# Patient Record
Sex: Female | Born: 1960 | Race: White | Hispanic: No | Smoking: Current every day smoker
Health system: Southern US, Community
[De-identification: ages and names within clinical notes are randomized; demographics above are authoritative.]

## PROBLEM LIST (undated history)

## (undated) DIAGNOSIS — I10 Essential (primary) hypertension: Secondary | ICD-10-CM

## (undated) DIAGNOSIS — E119 Type 2 diabetes mellitus without complications: Secondary | ICD-10-CM

## (undated) HISTORY — PX: ABDOMINAL HYSTERECTOMY: SHX81

## (undated) HISTORY — PX: FOOT SURGERY: SHX648

## (undated) HISTORY — PX: APPENDECTOMY: SHX54

---

## 2005-08-12 ENCOUNTER — Emergency Department (HOSPITAL_COMMUNITY): Admission: EM | Admit: 2005-08-12 | Discharge: 2005-08-12 | Payer: Self-pay | Admitting: Emergency Medicine

## 2005-10-24 ENCOUNTER — Emergency Department (HOSPITAL_COMMUNITY): Admission: EM | Admit: 2005-10-24 | Discharge: 2005-10-25 | Payer: Self-pay | Admitting: Emergency Medicine

## 2005-11-06 ENCOUNTER — Encounter: Admission: RE | Admit: 2005-11-06 | Discharge: 2005-11-06 | Payer: Self-pay | Admitting: *Deleted

## 2014-01-29 ENCOUNTER — Emergency Department (HOSPITAL_BASED_OUTPATIENT_CLINIC_OR_DEPARTMENT_OTHER)
Admission: EM | Admit: 2014-01-29 | Discharge: 2014-01-29 | Disposition: A | Discharge status: Home / Self Care | Payer: Managed Care, Other (non HMO) | Attending: Emergency Medicine | Admitting: Emergency Medicine

## 2014-01-29 ENCOUNTER — Encounter (HOSPITAL_BASED_OUTPATIENT_CLINIC_OR_DEPARTMENT_OTHER): Payer: Self-pay | Admitting: Emergency Medicine

## 2014-01-29 ENCOUNTER — Emergency Department (HOSPITAL_BASED_OUTPATIENT_CLINIC_OR_DEPARTMENT_OTHER): Payer: Managed Care, Other (non HMO)

## 2014-01-29 DIAGNOSIS — IMO0002 Reserved for concepts with insufficient information to code with codable children: Secondary | ICD-10-CM | POA: Insufficient documentation

## 2014-01-29 DIAGNOSIS — S93409A Sprain of unspecified ligament of unspecified ankle, initial encounter: Secondary | ICD-10-CM

## 2014-01-29 DIAGNOSIS — W19XXXA Unspecified fall, initial encounter: Secondary | ICD-10-CM

## 2014-01-29 DIAGNOSIS — Y939 Activity, unspecified: Secondary | ICD-10-CM | POA: Insufficient documentation

## 2014-01-29 DIAGNOSIS — Y929 Unspecified place or not applicable: Secondary | ICD-10-CM | POA: Insufficient documentation

## 2014-01-29 DIAGNOSIS — X500XXA Overexertion from strenuous movement or load, initial encounter: Secondary | ICD-10-CM | POA: Insufficient documentation

## 2014-01-29 DIAGNOSIS — W010XXA Fall on same level from slipping, tripping and stumbling without subsequent striking against object, initial encounter: Secondary | ICD-10-CM | POA: Insufficient documentation

## 2014-01-29 DIAGNOSIS — Z79899 Other long term (current) drug therapy: Secondary | ICD-10-CM | POA: Insufficient documentation

## 2014-01-29 DIAGNOSIS — S80219A Abrasion, unspecified knee, initial encounter: Secondary | ICD-10-CM

## 2014-01-29 DIAGNOSIS — F17200 Nicotine dependence, unspecified, uncomplicated: Secondary | ICD-10-CM | POA: Insufficient documentation

## 2014-01-29 MED ORDER — IBUPROFEN 600 MG PO TABS: 600.0000 mg | ORAL_TABLET | Freq: Three times a day (TID) | ORAL | Status: AC | PRN

## 2014-01-29 NOTE — ED Provider Notes (Signed)
CSN: [id number]     Arrival date & time 01/29/14  1105 History   First MD Initiated Contact with Patient 01/29/14 1112     Chief Complaint  Patient presents with  . Ankle Pain     (Consider location/radiation/quality/duration/timing/severity/associated sxs/prior Treatment) Patient is a 53 y.o. female presenting with ankle pain. The history is provided by the patient.  Ankle Pain Location:  Ankle and knee Time since incident:  1 day Injury: yes   Mechanism of injury: fall   Fall:    Fall occurred: Off a curb.   Height of fall:  <1 foot   Impact surface:  Concrete   Point of impact: L knee.   Entrapped after fall: no   Knee location:  L knee Ankle location:  R ankle Pain details:    Quality:  Aching   Radiates to:  Does not radiate   Severity:  Mild   Onset quality:  Sudden   Duration:  1 day   Timing:  Constant   Progression:  Improving Chronicity:  New Dislocation: no   Foreign body present:  No foreign bodies Tetanus status:  Up to date Relieved by:  Compression Associated symptoms: no fever     History reviewed. No pertinent past medical history. Past Surgical History  Procedure Laterality Date  . Abdominal hysterectomy    . Appendectomy    . Foot surgery     No family history on file. History  Substance Use Topics  . Smoking status: Current Every Day Smoker  . Smokeless tobacco: Not on file  . Alcohol Use: No   OB History   Grav Para Term Preterm Abortions TAB SAB Ect Mult Living                 Review of Systems  Constitutional: Negative for fever.  Respiratory: Negative for cough and shortness of breath.   Gastrointestinal: Negative for vomiting and abdominal pain.  All other systems reviewed and are negative.     Allergies  Codeine and Erythromycin  Home Medications   Prior to Admission medications   Medication Sig Start Date End Date Taking? Authorizing Mayda Shippee  pravastatin (PRAVACHOL) 40 MG tablet Take 40 mg by mouth daily.   Yes  Historical Billee Balcerzak, MD   BP 170/87  Pulse 86  Temp(Src) 98.1 F (36.7 C) (Oral)  SpO2 96% Physical Exam  Nursing note and vitals reviewed. Constitutional: She is oriented to person, place, and time. She appears well-developed and well-nourished. No distress.  HENT:  Head: Normocephalic and atraumatic.  Eyes: EOM are normal. Pupils are equal, round, and reactive to light.  Neck: Normal range of motion. Neck supple.  Cardiovascular: Normal rate and regular rhythm.  Exam reveals no friction rub.   No murmur heard. Pulmonary/Chest: Effort normal and breath sounds normal. No respiratory distress. She has no wheezes. She has no rales.  Abdominal: Soft. She exhibits no distension. There is no tenderness. There is no rebound.  Musculoskeletal: Normal range of motion. She exhibits no edema.       Left knee: She exhibits normal range of motion. Tenderness (patella) found.       Right ankle: She exhibits normal range of motion. Tenderness. Lateral malleolus and AITFL tenderness found. No medial malleolus, no CF ligament, no posterior TFL, no head of 5th metatarsal and no proximal fibula tenderness found.       Legs: Neurological: She is alert and oriented to person, place, and time.  Skin: She is not diaphoretic.  ED Course  Procedures (including critical care time) Labs Review Labs Reviewed - No data to display  Imaging Review Dg Ankle Complete Right  01/29/2014   CLINICAL DATA:  Pain post trauma  EXAM: RIGHT ANKLE - COMPLETE 3+ VIEW  COMPARISON:  None.  FINDINGS: Frontal, oblique, and lateral views were obtained. There is no fracture or effusion. Ankle mortise appears intact.  IMPRESSION: No abnormality noted.   Electronically Signed   By: Bretta BangWilliam  Woodruff M.D.   On: 01/29/2014 12:25   Dg Knee Complete 4 Views Left  01/29/2014   CLINICAL DATA:  Anterior knee pain following abrasions yesterday.  EXAM: LEFT KNEE - COMPLETE 4+ VIEW  COMPARISON:  None.  FINDINGS: The mineralization and  alignment are normal. There is no evidence of acute fracture or dislocation. The joint spaces are maintained. There is possible mild soft tissue irregularity anterior to the patella, best seen on the oblique views. No foreign body or significant joint effusion is seen.  IMPRESSION: No acute osseous findings or evidence of foreign body. Possible anterior soft tissue injury.   Electronically Signed   By: Roxy HorsemanBill  Veazey M.D.   On: 01/29/2014 12:09     EKG Interpretation None      MDM   Final diagnoses:  Fall  Knee abrasion  Ankle sprain    36F s/p fall yesterday. Tripped off a curb, hyperextended R ankle in planter flexion, landed on L knee. Pain improving, still having mild pain.  R ankle - mild lateral malleolar pain. Pain with inversion/eversion. Tenderness over ATFL. Normal ROM. Normal pulses. Mild lateral malleolus pain, no medial malleolus pain. Will xray. L knee - abrasion over knee, mild patellar pain. Normal ROM, NVI distally. Will xray. Xrays ok. Given motrin, instructed to continue wrapping it. I have reviewed all labs and imaging and considered them in my medical decision making.   Dagmar HaitWilliam Blair Walden, MD 01/29/14 [id number]

## 2014-01-29 NOTE — Discharge Instructions (Signed)
Abrasion °An abrasion is a cut or scrape of the skin. Abrasions do not extend through all layers of the skin and most heal within 10 days. It is important to care for your abrasion properly to prevent infection. °CAUSES  °Most abrasions are caused by falling on, or gliding across, the ground or other surface. When your skin rubs on something, the outer and inner layer of skin rubs off, causing an abrasion. °DIAGNOSIS  °Your caregiver will be able to diagnose an abrasion during a physical exam.  °TREATMENT  °Your treatment depends on how large and deep the abrasion is. Generally, your abrasion will be cleaned with water and a mild soap to remove any dirt or debris. An antibiotic ointment may be put over the abrasion to prevent an infection. A bandage (dressing) may be wrapped around the abrasion to keep it from getting dirty.  °You may need a tetanus shot if: °· You cannot remember when you had your last tetanus shot. °· You have never had a tetanus shot. °· The injury broke your skin. °If you get a tetanus shot, your arm may swell, get red, and feel warm to the touch. This is common and not a problem. If you need a tetanus shot and you choose not to have one, there is a rare chance of getting tetanus. Sickness from tetanus can be serious.  °HOME CARE INSTRUCTIONS  °· If a dressing was applied, change it at least once a day or as directed by your caregiver. If the bandage sticks, soak it off with warm water.   °· Wash the area with water and a mild soap to remove all the ointment 2 times a day. Rinse off the soap and pat the area dry with a clean towel.   °· Reapply any ointment as directed by your caregiver. This will help prevent infection and keep the bandage from sticking. Use gauze over the wound and under the dressing to help keep the bandage from sticking.   °· Change your dressing right away if it becomes wet or dirty.   °· Only take over-the-counter or prescription medicines for pain, discomfort, or fever as  directed by your caregiver.   °· Follow up with your caregiver within 24 48 hours for a wound check, or as directed. If you were not given a wound-check appointment, look closely at your abrasion for redness, swelling, or pus. These are signs of infection. °SEEK IMMEDIATE MEDICAL CARE IF:  °· You have increasing pain in the wound.   °· You have redness, swelling, or tenderness around the wound.   °· You have pus coming from the wound.   °· You have a fever or persistent symptoms for more than 2 3 days. °· You have a fever and your symptoms suddenly get worse. °· You have a bad smell coming from the wound or dressing.   °MAKE SURE YOU:  °· Understand these instructions. °· Will watch your condition. °· Will get help right away if you are not doing well or get worse. °Document Released: 05/12/2005 Document Revised: 07/19/2012 Document Reviewed: 07/06/2011 °[employer]® Patient Information ©2014 [employer]. ° °Ankle Sprain °An ankle sprain is an injury to the strong, fibrous tissues (ligaments) that hold the bones of your ankle joint together.  °CAUSES °An ankle sprain is usually caused by a fall or by twisting your ankle. Ankle sprains most commonly occur when you step on the outer edge of your foot, and your ankle turns inward. People who participate in sports are more prone to these types of injuries.  °  SYMPTOMS  °· Pain in your ankle. The pain may be present at rest or only when you are trying to stand or walk. °· Swelling. °· Bruising. Bruising may develop immediately or within 1 to 2 days after your injury. °· Difficulty standing or walking, particularly when turning corners or changing directions. °DIAGNOSIS  °Your caregiver will ask you details about your injury and perform a physical exam of your ankle to determine if you have an ankle sprain. During the physical exam, your caregiver will press on and apply pressure to specific areas of your foot and ankle. Your caregiver will try to move your ankle in certain  ways. An X-ray exam may be done to be sure a bone was not broken or a ligament did not separate from one of the bones in your ankle (avulsion fracture).  °TREATMENT  °Certain types of braces can help stabilize your ankle. Your caregiver can make a recommendation for this. Your caregiver may recommend the use of medicine for pain. If your sprain is severe, your caregiver may refer you to a surgeon who helps to restore function to parts of your skeletal system (orthopedist) or a physical therapist. °HOME CARE INSTRUCTIONS  °· Apply ice to your injury for 1 2 days or as directed by your caregiver. Applying ice helps to reduce inflammation and pain. °· Put ice in a plastic bag. °· Place a towel between your skin and the bag. °· Leave the ice on for 15-20 minutes at a time, every 2 hours while you are awake. °· Only take over-the-counter or prescription medicines for pain, discomfort, or fever as directed by your caregiver. °· Elevate your injured ankle above the level of your heart as much as possible for 2 3 days. °· If your caregiver recommends crutches, use them as instructed. Gradually put weight on the affected ankle. Continue to use crutches or a cane until you can walk without feeling pain in your ankle. °· If you have a plaster splint, wear the splint as directed by your caregiver. Do not rest it on anything harder than a pillow for the first 24 hours. Do not put weight on it. Do not get it wet. You may take it off to take a shower or bath. °· You may have been given an elastic bandage to wear around your ankle to provide support. If the elastic bandage is too tight (you have numbness or tingling in your foot or your foot becomes cold and blue), adjust the bandage to make it comfortable. °· If you have an air splint, you may blow more air into it or let air out to make it more comfortable. You may take your splint off at night and before taking a shower or bath. Wiggle your toes in the splint several times per  day to decrease swelling. °SEEK MEDICAL CARE IF:  °· You have rapidly increasing bruising or swelling. °· Your toes feel extremely cold or you lose feeling in your foot. °· Your pain is not relieved with medicine. °SEEK IMMEDIATE MEDICAL CARE IF: °· Your toes are numb or blue. °· You have severe pain that is increasing. °MAKE SURE YOU:  °· Understand these instructions. °· Will watch your condition. °· Will get help right away if you are not doing well or get worse. °Document Released: 08/02/2005 Document Revised: 04/26/2012 Document Reviewed: 08/14/2011 °ExitCare® Patient Information ©2014 [employer]. ° °

## 2014-01-29 NOTE — ED Notes (Signed)
Pt tripped and fell yesterday afternoon twisting and hyperextending her right ankle and skinned her left knee.

## 2021-01-18 ENCOUNTER — Other Ambulatory Visit: Payer: Self-pay

## 2021-01-18 ENCOUNTER — Encounter (HOSPITAL_BASED_OUTPATIENT_CLINIC_OR_DEPARTMENT_OTHER): Payer: Self-pay | Admitting: Emergency Medicine

## 2021-01-18 ENCOUNTER — Emergency Department (HOSPITAL_BASED_OUTPATIENT_CLINIC_OR_DEPARTMENT_OTHER)
Admission: EM | Admit: 2021-01-18 | Discharge: 2021-01-18 | Disposition: A | Discharge status: Home / Self Care | Payer: Managed Care, Other (non HMO) | Attending: Emergency Medicine | Admitting: Emergency Medicine

## 2021-01-18 DIAGNOSIS — H1131 Conjunctival hemorrhage, right eye: Secondary | ICD-10-CM | POA: Insufficient documentation

## 2021-01-18 DIAGNOSIS — Z23 Encounter for immunization: Secondary | ICD-10-CM | POA: Diagnosis not present

## 2021-01-18 DIAGNOSIS — W228XXA Striking against or struck by other objects, initial encounter: Secondary | ICD-10-CM | POA: Insufficient documentation

## 2021-01-18 DIAGNOSIS — H5711 Ocular pain, right eye: Secondary | ICD-10-CM | POA: Diagnosis present

## 2021-01-18 DIAGNOSIS — F17200 Nicotine dependence, unspecified, uncomplicated: Secondary | ICD-10-CM | POA: Insufficient documentation

## 2021-01-18 MED ORDER — TETRACAINE HCL 0.5 % OP SOLN
1.0000 [drp] | Freq: Once | OPHTHALMIC | Status: AC
  Administered 2021-01-18: 1 [drp] via OPHTHALMIC
  Filled 2021-01-18: qty 4

## 2021-01-18 MED ORDER — FLUORESCEIN SODIUM 1 MG OP STRP
ORAL_STRIP | OPHTHALMIC | Status: AC
  Administered 2021-01-18: 1 via OPHTHALMIC
  Filled 2021-01-18: qty 1

## 2021-01-18 MED ORDER — TETANUS-DIPHTH-ACELL PERTUSSIS 5-2.5-18.5 LF-MCG/0.5 IM SUSY
0.5000 mL | PREFILLED_SYRINGE | Freq: Once | INTRAMUSCULAR | Status: AC
  Administered 2021-01-18: 0.5 mL via INTRAMUSCULAR
  Filled 2021-01-18: qty 0.5

## 2021-01-18 MED ORDER — FLUORESCEIN SODIUM 1 MG OP STRP: 1.0000 | ORAL_STRIP | Freq: Once | OPHTHALMIC | Status: AC

## 2021-01-18 NOTE — ED Provider Notes (Signed)
I personally evaluated the patient during the encounter and completed a history, physical, procedures, medical decision making to contribute to the overall care of the patient and decision making for the patient briefly, the patient is a 60 y.o. female here after hitting her right eye with the back end of the shovel.  Has fairly extensive subconjunctival hemorrhage.  Does not have any obvious lacerations to the cornea or conjunctiva.  No facial lacerations.  No signs of entrapment and patient has normal extraocular motion.  She has equal and round pupils bilaterally to light.  She has 20/20 vision bilaterally.  There does not appear to be any evidence of open globe.  Overall touch base with ophthalmology to arrange for close follow-up tomorrow.  We will start artificial tears and recommend ice.  Discharged in ED in good condition.  This chart was dictated using voice recognition software.  Despite best efforts to proofread,  errors can occur which can change the documentation meaning.     EKG Interpretation None           Virgina Norfolk, DO 01/18/21 1639

## 2021-01-18 NOTE — ED Triage Notes (Signed)
Pt was killing a snake with a shovel when she hit her right eye with the handle of the shovel, occurred about half an hour PTA. Pt presents with blood noted to eye area.

## 2021-01-18 NOTE — ED Notes (Signed)
Pt states she now can't see out of her right eye. Dr. Lockie Mola made aware

## 2021-01-18 NOTE — ED Provider Notes (Signed)
[facility] EMERGENCY DEPARTMENT Provider Note   CSN: [id number] Arrival date & time: 01/18/21  1401     History Chief Complaint  Patient presents with  . Eye Injury    Becky Simpson is a 60 y.o. female.  HPI 60 year old female with no sniffing medical history presents to the ER with complaints of right eye pain after hitting her self in the eye with a shovel while try to kill a snake.  Presents with redness to the eye and some eye pain.  She states occasionally she will have some blurry vision but this will improve.  Denies any pain with eye movement.  Unsure when her last tetanus shot was    History reviewed. No pertinent past medical history.  There are no problems to display for this patient.   Past Surgical History:  Procedure Laterality Date  . ABDOMINAL HYSTERECTOMY    . APPENDECTOMY    . FOOT SURGERY       OB History   No obstetric history on file.     No family history on file.  Social History   Tobacco Use  . Smoking status: Current Every Day Smoker  . Smokeless tobacco: Never Used  Vaping Use  . Vaping Use: Never used  Substance Use Topics  . Alcohol use: No  . Drug use: No    Home Medications Prior to Admission medications   Medication Sig Start Date End Date Taking? Authorizing Provider  ibuprofen (ADVIL,MOTRIN) 600 MG tablet Take 1 tablet (600 mg total) by mouth every 8 (eight) hours as needed for moderate pain. 01/29/14   Elwin Mocha, MD  pravastatin (PRAVACHOL) 40 MG tablet Take 40 mg by mouth daily.    [provider]    Allergies    Codeine and Erythromycin  Review of Systems   Review of Systems  HENT: Negative for facial swelling.   Eyes: Positive for pain and redness. Negative for photophobia, discharge, itching and visual disturbance.    Physical Exam Updated Vital Signs BP (!) 172/96 (BP Location: Right Arm)   Pulse 88   Temp 98.4 F (36.9 C) (Oral)   Resp 18   Ht 5' 7.5" (1.715 m)   Wt 83.9 kg    SpO2 93%   BMI 28.55 kg/m   Physical Exam Vitals and nursing note reviewed.  Constitutional:      General: She is not in acute distress.    Appearance: She is well-developed. She is not diaphoretic.  HENT:     Head: Normocephalic and atraumatic.     Comments: No of hemotympanum, raccoon eyes, battle sign.  No mastoid tenderness.  No malocclusion.  No evidence of lacerations, cranial deformities. Full range of motion of head and neck.  Eyes:     Extraocular Movements: Extraocular movements intact.     Pupils: Pupils are equal, round, and reactive to light.     Comments: Right eye with significant subconjunctival hemorrhage.  Pupils equal and reactive, vision 20/20 out of both eyes.  No visible signs of entrapment, EOMs intact.  Fluorescein stain with Woods lamp performed at bedside, with no visible uptake or seidel sign. Left eye without erythema, PERRLA. EOMS intact bilaterally   Cardiovascular:     Rate and Rhythm: Normal rate and regular rhythm.     Heart sounds: No murmur heard.   Pulmonary:     Effort: Pulmonary effort is normal. No respiratory distress.     Breath sounds: Normal breath sounds.  Abdominal:  Palpations: Abdomen is soft.     Tenderness: There is no abdominal tenderness.  Musculoskeletal:        General: Normal range of motion.     Cervical back: Neck supple.  Skin:    General: Skin is warm and dry.  Neurological:     General: No focal deficit present.     Mental Status: She is alert and oriented to person, place, and time.  Psychiatric:        Mood and Affect: Mood normal.        Behavior: Behavior normal.     ED Results / Procedures / Treatments   Labs (all labs ordered are listed, but only abnormal results are displayed) Labs Reviewed - No data to display  EKG None  Radiology No results found.  Procedures Procedures   Medications Ordered in ED Medications  tetracaine (PONTOCAINE) 0.5 % ophthalmic solution 1 drop (1 drop Right Eye Given  by Other 01/18/21 1618)  fluorescein ophthalmic strip 1 strip (1 strip Right Eye Given 01/18/21 1646)  Tdap (BOOSTRIX) injection 0.5 mL (0.5 mLs Intramuscular Given 01/18/21 1645)    ED Course  I have reviewed the triage vital signs and the nursing notes.  Pertinent labs & imaging results that were available during my care of the patient were reviewed by me and considered in my medical decision making (see chart for details).    MDM Rules/Calculators/A&P                         60 year old female presents to the ER with right eye pain and redness after hitting herself in the eye with a shovel.  On arrival, she does appear to have a significant subconjunctival hemorrhage.  No visible lacerations on the cornea or the conjunctiva with fluorescein stain.  No visible facial lacerations.  No other signs of entrapment, EOMs intact.  Pupils equal and reactive, with vision 20/20 bilaterally.  No evidence of open globe injury.  No visible raccoon eyes.  Spoke with Dr. Jenene Slicker with ophthalmology, she recommends artificial tears, Tylenol, cold compress.  We will hold antibiotic cream given no visible lacerations.  Tetanus shot updated will provide close follow-up with ophthalmology tomorrow.  We discussed return precautions.  Patient voiced understanding is agreeable.  This was a shared visit with my supervising physician Dr. Lockie Mola who independently saw and evaluated the patient & provided guidance in evaluation/management/disposition ,in agreement with care   Final Clinical Impression(s) / ED Diagnoses Final diagnoses:  Subconjunctival bleed, right    Rx / DC Orders ED Discharge Orders    None       Mare Ferrari, PA-C 01/18/21 1649    Virgina Norfolk, DO 01/18/21 2325

## 2021-01-18 NOTE — Discharge Instructions (Signed)
Please use artificial tears, take Tylenol for pain, apply a ice pack. Follow up with Dr. Jenene Slicker with ophthalmology tomorrow if your symptoms persist

## 2021-11-28 ENCOUNTER — Encounter (HOSPITAL_BASED_OUTPATIENT_CLINIC_OR_DEPARTMENT_OTHER): Payer: Self-pay

## 2021-11-28 ENCOUNTER — Emergency Department (HOSPITAL_BASED_OUTPATIENT_CLINIC_OR_DEPARTMENT_OTHER): Payer: Managed Care, Other (non HMO)

## 2021-11-28 ENCOUNTER — Emergency Department (HOSPITAL_BASED_OUTPATIENT_CLINIC_OR_DEPARTMENT_OTHER)
Admission: EM | Admit: 2021-11-28 | Discharge: 2021-11-28 | Disposition: A | Discharge status: Home / Self Care | Payer: Managed Care, Other (non HMO) | Attending: Emergency Medicine | Admitting: Emergency Medicine

## 2021-11-28 ENCOUNTER — Other Ambulatory Visit: Payer: Self-pay

## 2021-11-28 DIAGNOSIS — S51811A Laceration without foreign body of right forearm, initial encounter: Secondary | ICD-10-CM | POA: Diagnosis not present

## 2021-11-28 DIAGNOSIS — W540XXA Bitten by dog, initial encounter: Secondary | ICD-10-CM | POA: Diagnosis not present

## 2021-11-28 DIAGNOSIS — Y92488 Other paved roadways as the place of occurrence of the external cause: Secondary | ICD-10-CM | POA: Insufficient documentation

## 2021-11-28 DIAGNOSIS — S80211A Abrasion, right knee, initial encounter: Secondary | ICD-10-CM | POA: Insufficient documentation

## 2021-11-28 DIAGNOSIS — S50311A Abrasion of right elbow, initial encounter: Secondary | ICD-10-CM | POA: Insufficient documentation

## 2021-11-28 DIAGNOSIS — S60511A Abrasion of right hand, initial encounter: Secondary | ICD-10-CM | POA: Insufficient documentation

## 2021-11-28 DIAGNOSIS — T148XXA Other injury of unspecified body region, initial encounter: Secondary | ICD-10-CM

## 2021-11-28 DIAGNOSIS — S59911A Unspecified injury of right forearm, initial encounter: Secondary | ICD-10-CM | POA: Diagnosis present

## 2021-11-28 HISTORY — DX: Type 2 diabetes mellitus without complications: E11.9

## 2021-11-28 HISTORY — DX: Essential (primary) hypertension: I10

## 2021-11-28 MED ORDER — LIDOCAINE-EPINEPHRINE 2 %-1:200000 IJ SOLN
10.0000 mL | Freq: Once | INTRAMUSCULAR | Status: AC
Start: 2021-11-28 — End: 2021-11-28
  Administered 2021-11-28: 10 mL
  Filled 2021-11-28: qty 20

## 2021-11-28 MED ORDER — AMOXICILLIN-POT CLAVULANATE 875-125 MG PO TABS: 1.0000 | ORAL_TABLET | Freq: Two times a day (BID) | ORAL | 0 refills | Status: AC

## 2021-11-28 NOTE — ED Triage Notes (Addendum)
Pt arrives ambulatory to ED with reports of being bitten by neighbors dog today. Neighbors report that dog was up to date on shots but could not prove it to patient she states that she will call them again to see if they can find documentation. Pt has abrasions to right knuckles and bite to left forearm. Also has some swelling and abrasions to right knee and right elbow.  ?

## 2021-11-28 NOTE — Discharge Instructions (Signed)
I have sent you in a prescription for an antibiotic that you will take twice daily for 5 days.   ? ?1. Medications: Tylenol or ibuprofen for pain, usual home medications ?2. Treatment: ice for swelling, keep wound clean with warm soap and water and keep bandage dry, do not submerge in water for 24 hours ?3. Follow Up: Please return or follow up with primary care provider in 8 days to have your stitches/staples removed or sooner if you have concerns. Return to the emergency department for increased redness, drainage of pus from the wound, or fevers/chills. ? ? ?WOUND CARE ? Keep area clean and dry for 24 hours. Do not remove bandage, if applied. ? After 24 hours, remove bandage and wash wound gently with mild soap and warm water. Reapply a new bandage after cleaning wound, if directed.  ? Continue daily cleansing with soap and water until stitches/staples are removed. ? Do not apply any ointments or creams to the wound while stitches/staples are in place, as this may cause delayed healing. ?Return if you experience any of the following signs of infection: Swelling, redness, pus drainage, streaking, fever >101.0 F ? Return if you experience excessive bleeding that does not stop after 15-20 minutes of constant, firm pressure. ? ?

## 2021-11-28 NOTE — ED Provider Notes (Signed)
?[facility] EMERGENCY DEPARTMENT ?Provider Note ? ? ?CSN: [id number] ?Arrival date & time: 11/28/21  1112 ? ?  ? ?History ? ?Chief Complaint  ?Patient presents with  ? Animal Bite  ? ? ?Becky Simpson is a 61 y.o. female who presents to the ED for evaluation of a dog bite laceration to her left forearm that occurred just prior to arrival.  Patient states that the dog was a white treatment Shepherd who latched on the side of her left hand, knocking her over onto the asphalt in the street causing abrasions to the right hand, knee and elbow.  No treatment prior to arrival.  The dog is up-to-date on vaccines and has records sent over to her from the [facility].  No other systemic complaints. ? ?Animal Bite ? ?  ? ?Home Medications ?Prior to Admission medications   ?Medication Sig Start Date End Date Taking? Authorizing Provider  ?amoxicillin-clavulanate (AUGMENTIN) 875-125 MG tablet Take 1 tablet by mouth every 12 (twelve) hours for 5 days. 11/28/21 12/03/21 Yes Raynald Blendonklin, Inell Mimbs R, PA-C  ?losartan (COZAAR) 50 MG tablet Take 50 mg by mouth daily.   Yes [provider]  ?metFORMIN (GLUCOPHAGE) 1000 MG tablet Take 1,000 mg by mouth 2 (two) times daily with a meal.   Yes [provider]  ?ibuprofen (ADVIL,MOTRIN) 600 MG tablet Take 1 tablet (600 mg total) by mouth every 8 (eight) hours as needed for moderate pain. 01/29/14   Elwin MochaWalden, Blair, MD  ?pravastatin (PRAVACHOL) 40 MG tablet Take 40 mg by mouth daily.    [provider]  ?   ? ?Allergies    ?Codeine and Erythromycin   ? ?Review of Systems   ?Review of Systems ? ?Physical Exam ?Updated Vital Signs ?BP (!) 166/91   Pulse 87   Temp 99.3 ?F (37.4 ?C) (Oral)   Resp 16   Ht 5\' 7"  (1.702 m)   Wt 83 kg   SpO2 95%   BMI 28.66 kg/m?  ?Physical Exam ?Vitals and nursing note reviewed.  ?Constitutional:   ?   General: She is not in acute distress. ?   Appearance: She is not ill-appearing.  ?HENT:  ?   Head: Atraumatic.  ?Eyes:  ?    Conjunctiva/sclera: Conjunctivae normal.  ?Cardiovascular:  ?   Rate and Rhythm: Normal rate and regular rhythm.  ?   Pulses: Normal pulses.  ?   Heart sounds: No murmur heard. ?Pulmonary:  ?   Effort: Pulmonary effort is normal. No respiratory distress.  ?   Breath sounds: Normal breath sounds.  ?Abdominal:  ?   General: Abdomen is flat. There is no distension.  ?   Palpations: Abdomen is soft.  ?   Tenderness: There is no abdominal tenderness.  ?Musculoskeletal:     ?   General: Normal range of motion.  ?   Cervical back: Normal range of motion.  ?Skin: ?   General: Skin is warm and dry.  ?   Capillary Refill: Capillary refill takes less than 2 seconds.  ? ?    ?   Comments: 6 cm laceration to the left dorsum of forearm ? ?2 small abrasions to the right knee, abrasions to the right hand and right elbow.  ?Neurological:  ?   General: No focal deficit present.  ?   Mental Status: She is alert.  ?Psychiatric:     ?   Mood and Affect: Mood normal.  ? ? ? ?ED Results / Procedures / Treatments   ?  Labs ?(all labs ordered are listed, but only abnormal results are displayed) ?Labs Reviewed - No data to display ? ?EKG ?None ? ?Radiology ?DG Knee Complete 4 Views Right ? ?Result Date: 11/28/2021 ?CLINICAL DATA:  Trauma, fall, pain EXAM: RIGHT KNEE - COMPLETE 4+ VIEW COMPARISON:  None. FINDINGS: No fracture or dislocation is seen. There is no significant effusion. Linear calcification in the calf may suggest vascular calcification. IMPRESSION: No fracture or dislocation is seen in the right knee. Electronically Signed   By: Ernie Avena M.D.   On: 11/28/2021 12:49   ? ?Procedures ?Marland Kitchen.Laceration Repair ? ?Date/Time: 11/28/2021 2:59 PM ?Performed by: Janell Quiet, PA-C ?Authorized by: Janell Quiet, PA-C  ? ?Consent:  ?  Consent obtained:  Verbal ?  Consent given by:  Patient ?  Risks discussed:  Infection, need for additional repair, pain, poor cosmetic result and poor wound healing ?  Alternatives discussed:  No  treatment and delayed treatment ?Universal protocol:  ?  Procedure explained and questions answered to patient or proxy's satisfaction: yes   ?  Relevant documents present and verified: yes   ?  Test results available: yes   ?  Imaging studies available: yes   ?  Required blood products, implants, devices, and special equipment available: yes   ?  Site/side marked: yes   ?  Immediately prior to procedure, a time out was called: yes   ?  Patient identity confirmed:  Verbally with patient ?Anesthesia:  ?  Anesthesia method:  Local infiltration ?  Local anesthetic:  Lidocaine 2% WITH epi ?Laceration details:  ?  Location:  Shoulder/arm ?  Shoulder/arm location:  R lower arm ?  Length (cm):  6 ?  Depth (mm):  4 ?Treatment:  ?  Area cleansed with:  Povidone-iodine ?  Amount of cleaning:  Extensive ?  Irrigation solution:  Sterile saline ?  Irrigation volume:  ?  Irrigation method:  Syringe ?  Visualized foreign bodies/material removed: no   ?  Debridement:  None ?  Undermining:  None ?  Scar revision: no   ?Skin repair:  ?  Repair method:  Sutures ?  Suture size:  4-0 ?  Suture material:  Prolene ?  Suture technique:  Simple interrupted ?  Number of sutures:  2 ?Approximation:  ?  Approximation:  Loose ?Repair type:  ?  Repair type:  Simple ?Post-procedure details:  ?  Dressing:  Antibiotic ointment and bulky dressing ?  Procedure completion:  Tolerated  ? ?Medications Ordered in ED ?Medications  ?lidocaine-EPINEPHrine (XYLOCAINE W/EPI) 2 %-1:200000 (PF) injection 10 mL (10 mLs Infiltration Given by Other 11/28/21 1304)  ? ? ?ED Course/ Medical Decision Making/ A&P ?  ?                        ?Medical Decision Making ?Amount and/or Complexity of Data Reviewed ?Radiology: ordered. ? ?Risk ?Prescription drug management. ? ? ?Pressure irrigation performed. Wound explored and base of wound visualized in a bloodless field without evidence of foreign body.  Laceration occurred < 8 hours prior to repair which was well  tolerated. Pt UTD on Tdap. Pt has  no comorbidities to effect normal wound healing. Pt discharged with Augmentin for infection prophylaxis.  Discussed suture home care with patient and answered questions. Pt to follow-up for wound check and suture removal in 7 days; they are to return to the ED sooner for signs of infection. Pt is hemodynamically stable with  no complaints prior to dc.  ? ? ?Final Clinical Impression(s) / ED Diagnoses ?Final diagnoses:  ?Animal bite  ?Laceration of right forearm, initial encounter  ? ? ?Rx / DC Orders ?ED Discharge Orders   ? ?      Ordered  ?  amoxicillin-clavulanate (AUGMENTIN) 875-125 MG tablet  Every 12 hours       ? 11/28/21 1343  ? ?  ?  ? ?  ? ? ?  ?Janell Quiet, [place] ?11/28/21 1501 ? ?  ?Rozelle Logan, DO ?11/28/21 1552 ? ?

## 2021-11-28 NOTE — ED Notes (Signed)
ED Provider at bedside. 

## 2023-02-13 IMAGING — DX DG KNEE COMPLETE 4+V*R*
4 series · 4 of 4 positions shown · non-contrast
Comparison: None.

CLINICAL DATA: Trauma, fall, pain

EXAM:
RIGHT KNEE - COMPLETE 4+ VIEW

[knee ap]
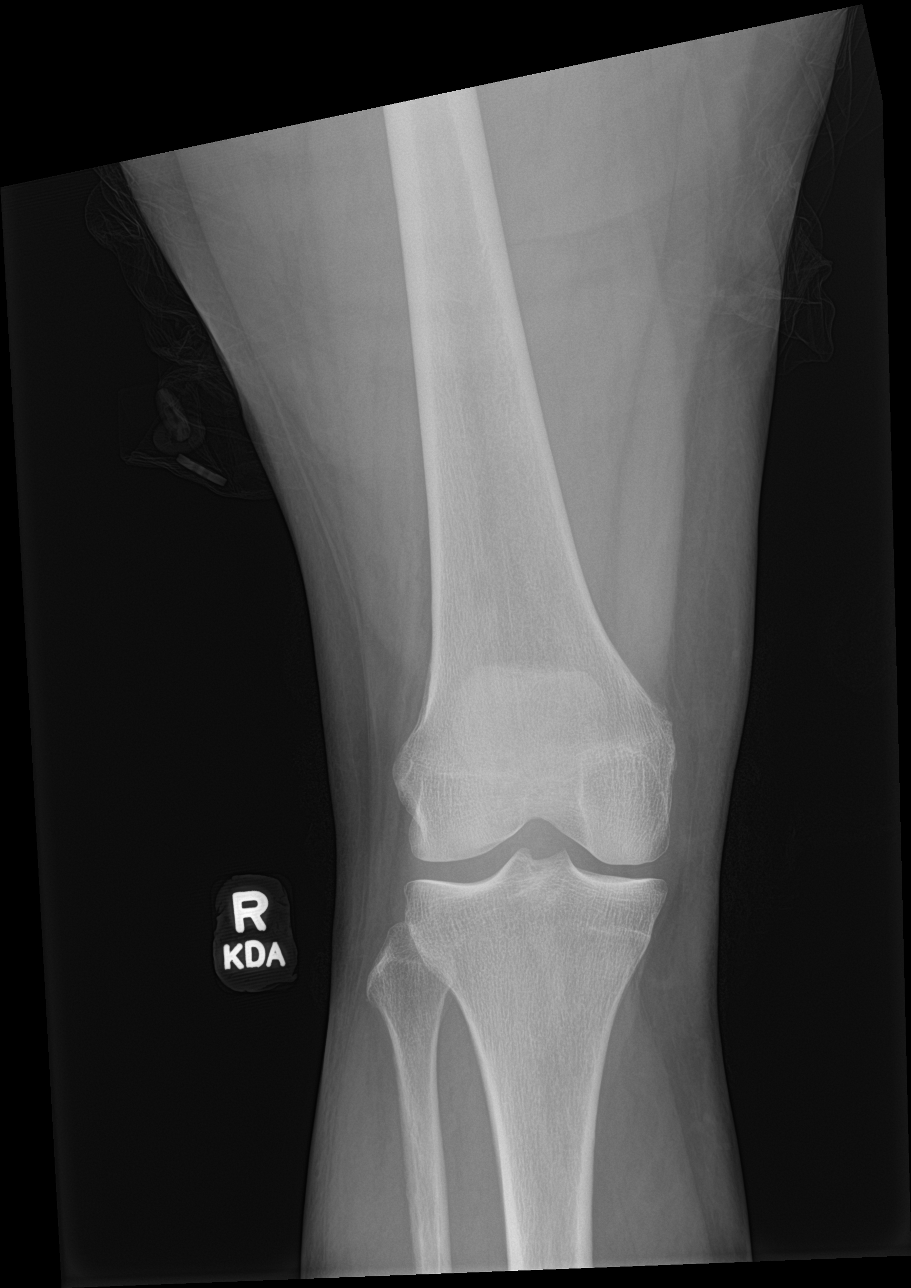

[knee lat]
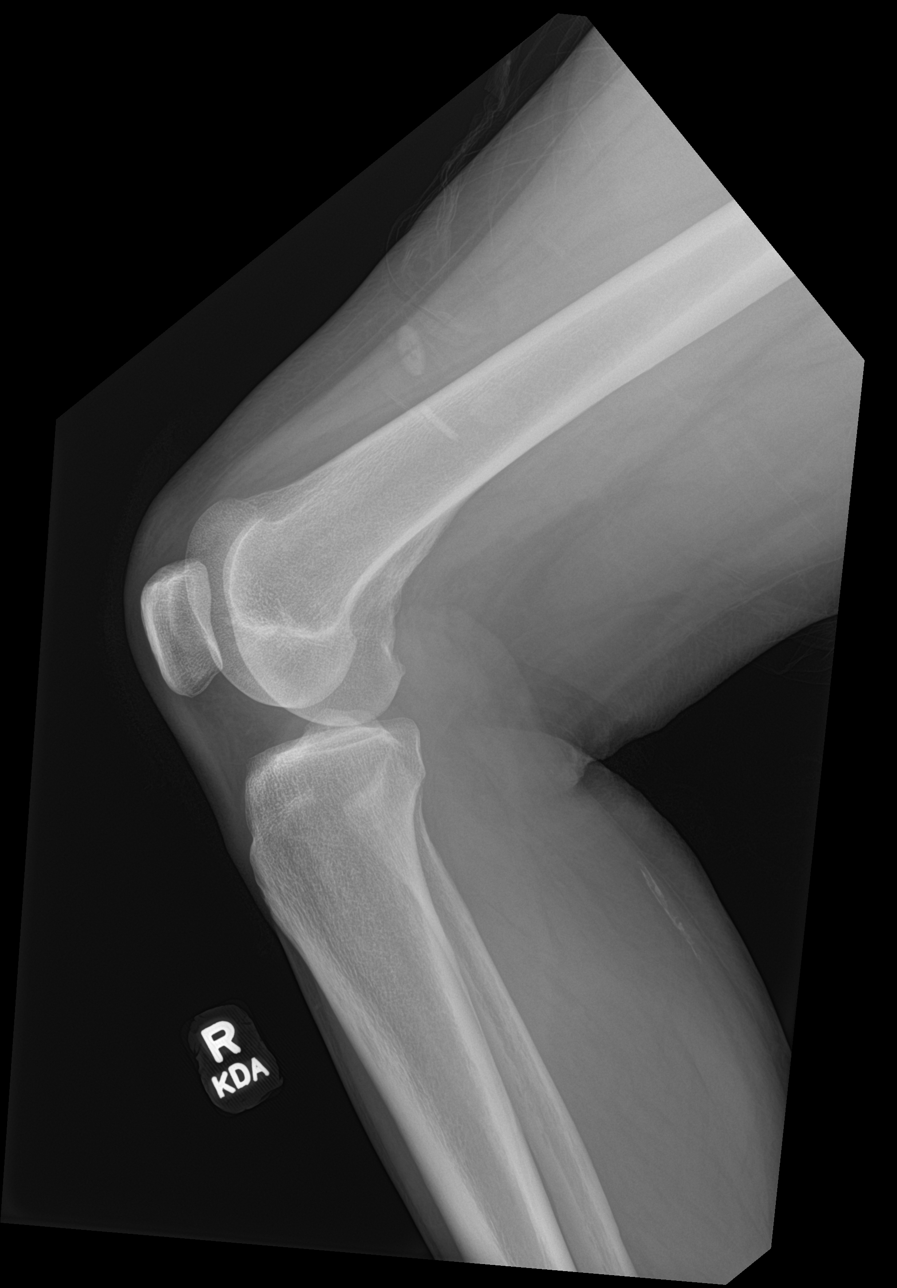

[knee obl (1 of 2)]
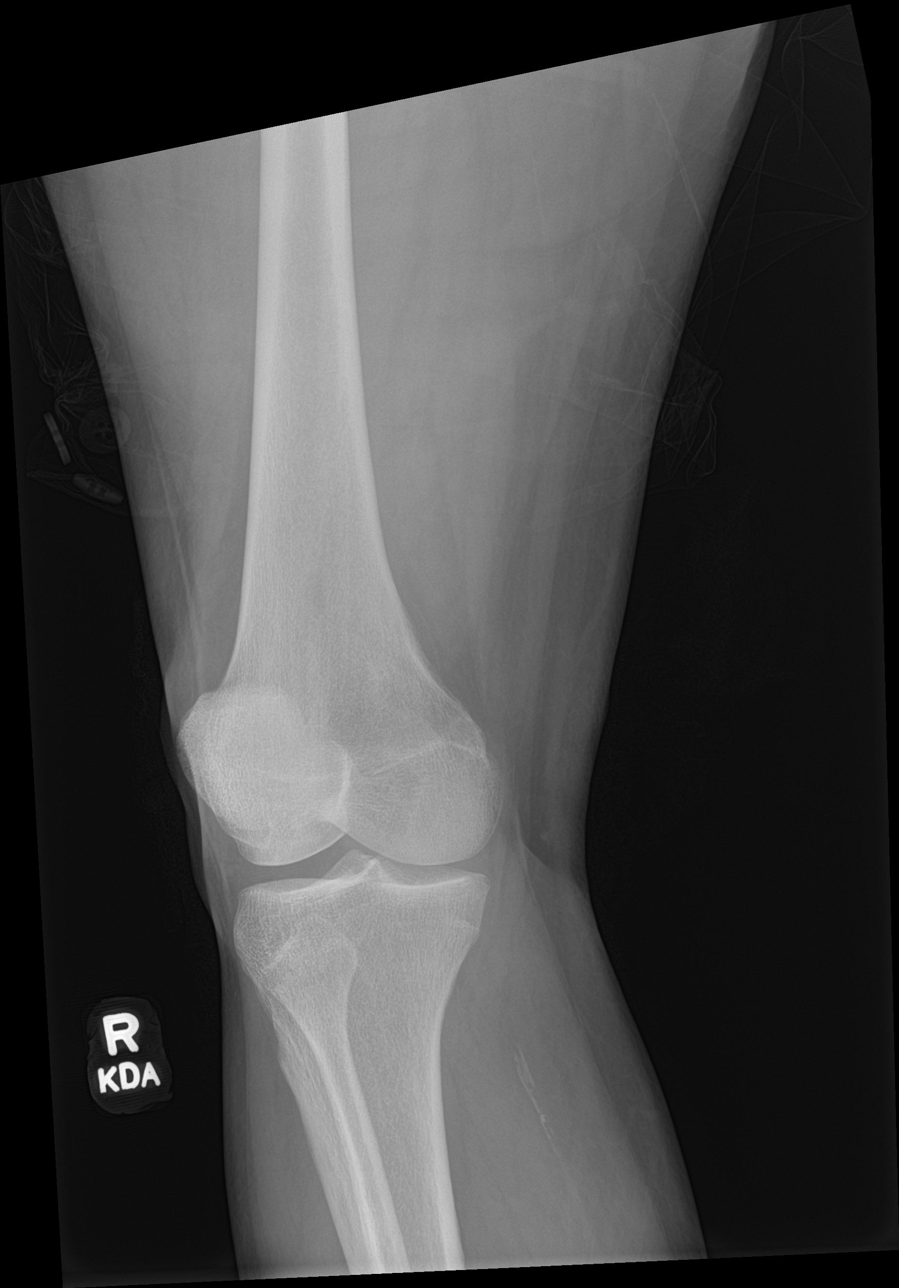

[knee obl (2 of 2)]
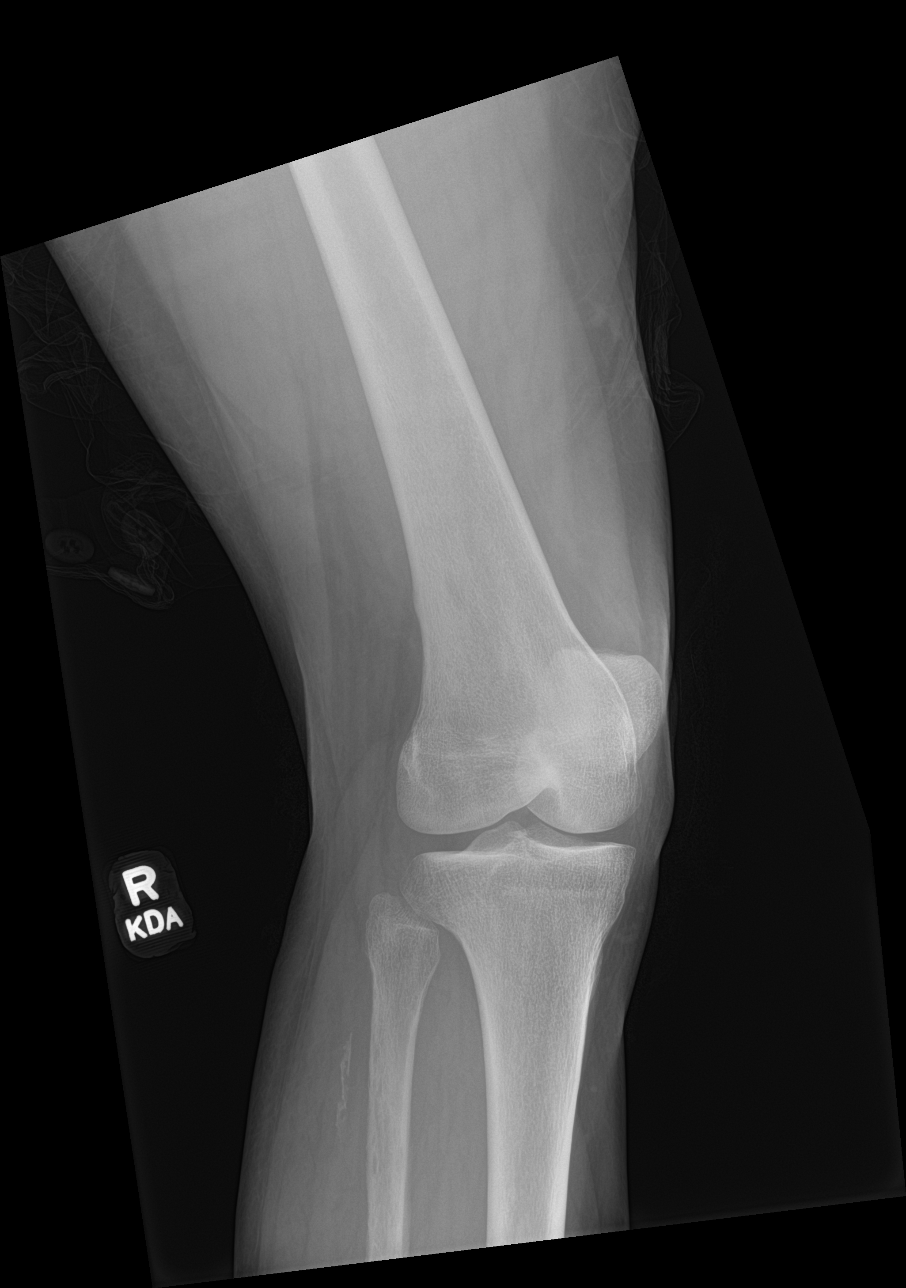

[4 of 4 positions shown; findings below may reference images not displayed]

FINDINGS: No fracture or dislocation is seen. There is no significant
effusion. Linear calcification in the calf may suggest vascular
calcification.
IMPRESSION: No fracture or dislocation is seen in the right knee.
